# Patient Record
Sex: Female | Born: 1950 | Race: White | Hispanic: No | Smoking: Current every day smoker
Health system: Southern US, Community
[De-identification: ages and names within clinical notes are randomized; demographics above are authoritative.]

## PROBLEM LIST (undated history)

## (undated) DIAGNOSIS — E7800 Pure hypercholesterolemia, unspecified: Secondary | ICD-10-CM

## (undated) HISTORY — PX: COLONOSCOPY: SHX174

---

## 2000-08-13 ENCOUNTER — Ambulatory Visit (HOSPITAL_COMMUNITY): Admission: RE | Admit: 2000-08-13 | Discharge: 2000-08-13 | Payer: Self-pay | Admitting: Pulmonary Disease

## 2001-03-01 ENCOUNTER — Encounter: Payer: Self-pay | Admitting: Internal Medicine

## 2001-03-01 ENCOUNTER — Ambulatory Visit (HOSPITAL_COMMUNITY): Admission: RE | Admit: 2001-03-01 | Discharge: 2001-03-01 | Payer: Self-pay | Admitting: Internal Medicine

## 2001-11-09 ENCOUNTER — Encounter: Payer: Self-pay | Admitting: Internal Medicine

## 2001-11-09 ENCOUNTER — Emergency Department (HOSPITAL_COMMUNITY): Admission: EM | Admit: 2001-11-09 | Discharge: 2001-11-09 | Payer: Self-pay | Admitting: Internal Medicine

## 2004-04-10 ENCOUNTER — Ambulatory Visit (HOSPITAL_COMMUNITY): Admission: RE | Admit: 2004-04-10 | Discharge: 2004-04-10 | Payer: Self-pay | Admitting: Family Medicine

## 2004-07-05 ENCOUNTER — Ambulatory Visit: Payer: Self-pay | Admitting: Internal Medicine

## 2004-07-05 ENCOUNTER — Ambulatory Visit (HOSPITAL_COMMUNITY): Admission: RE | Admit: 2004-07-05 | Discharge: 2004-07-05 | Payer: Self-pay | Admitting: Internal Medicine

## 2004-11-22 ENCOUNTER — Emergency Department (HOSPITAL_COMMUNITY): Admission: EM | Admit: 2004-11-22 | Discharge: 2004-11-22 | Payer: Self-pay | Admitting: Emergency Medicine

## 2010-03-22 ENCOUNTER — Emergency Department (HOSPITAL_COMMUNITY)
Admission: EM | Admit: 2010-03-22 | Discharge: 2010-03-22 | Payer: Self-pay | Source: Home / Self Care | Admitting: Emergency Medicine

## 2010-03-22 LAB — COMPREHENSIVE METABOLIC PANEL
ALT: 20 U/L (ref 0–35)
AST: 21 U/L (ref 0–37)
Albumin: 3.9 g/dL (ref 3.5–5.2)
Alkaline Phosphatase: 88 U/L (ref 39–117)
CO2: 28 mEq/L (ref 19–32)
Chloride: 105 mEq/L (ref 96–112)
Creatinine, Ser: 0.52 mg/dL (ref 0.4–1.2)
GFR calc Af Amer: >60 mL/min (ref >60)
GFR calc non Af Amer: >60 mL/min (ref >60)
Potassium: 4.6 mEq/L (ref 3.5–5.1)
Total Bilirubin: 0.4 mg/dL (ref 0.3–1.2)

## 2010-03-22 LAB — DIFFERENTIAL
Basophils Absolute: 0 10*3/uL (ref 0.0–0.1)
Basophils Relative: 0 % (ref 0–1)
Eosinophils Absolute: 0.2 10*3/uL (ref 0.0–0.7)
Eosinophils Relative: 2 % (ref 0–5)
Lymphocytes Relative: 24 % (ref 12–46)
Lymphs Abs: 1.9 10*3/uL (ref 0.7–4.0)
Monocytes Absolute: 0.6 10*3/uL (ref 0.1–1.0)
Monocytes Relative: 8 % (ref 3–12)
Neutro Abs: 5.1 10*3/uL (ref 1.7–7.7)
Neutrophils Relative %: 65 % (ref 43–77)

## 2010-03-22 LAB — CBC
Hemoglobin: 13.4 g/dL (ref 12.0–15.0)
MCH: 32 pg (ref 26.0–34.0)
RBC: 4.19 MIL/uL (ref 3.87–5.11)
WBC: 7.8 10*3/uL (ref 4.0–10.5)

## 2010-03-22 LAB — URINALYSIS, ROUTINE W REFLEX MICROSCOPIC
Bilirubin Urine: NEGATIVE
Hgb urine dipstick: NEGATIVE
Ketones, ur: NEGATIVE mg/dL
Protein, ur: NEGATIVE mg/dL
Urine Glucose, Fasting: NEGATIVE mg/dL
Urobilinogen, UA: 0.2 mg/dL (ref 0.0–1.0)

## 2011-06-04 IMAGING — US US ABDOMEN COMPLETE
1 series · 14 of 25 positions shown · non-contrast
Comparison: None

CLINICAL DATA: Upper abdominal pain

ULTRASOUND ABDOMEN:
TECHNIQUE: Sonography of upper abdominal structures was performed.

[Series 1: us abdomen complete · 0.23mm/px · 14 of 63 slices shown]
[im 1/63]
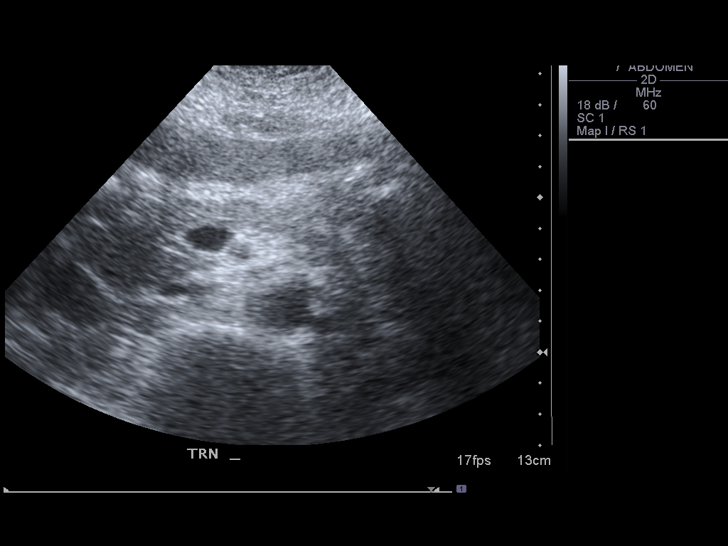
[im 6/63]
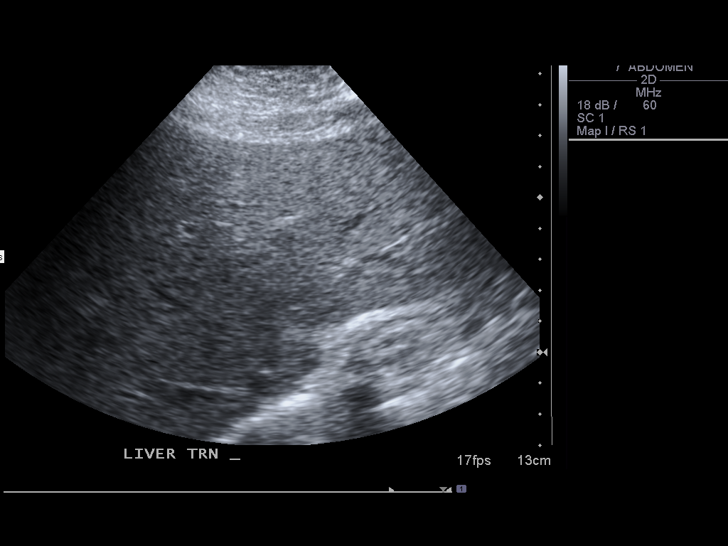
[im 11/63]
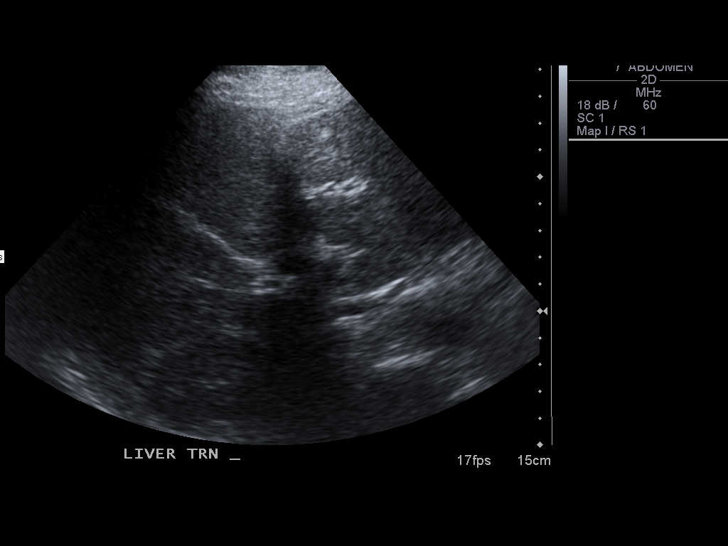
[im 16/63]
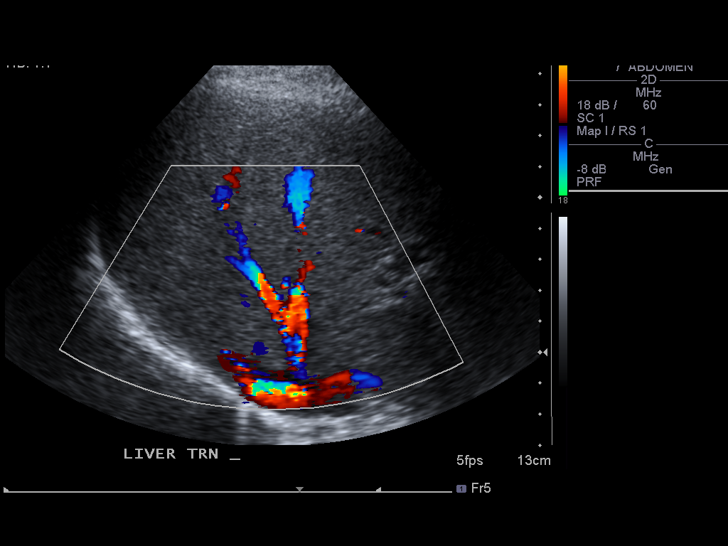
[im 21/63]
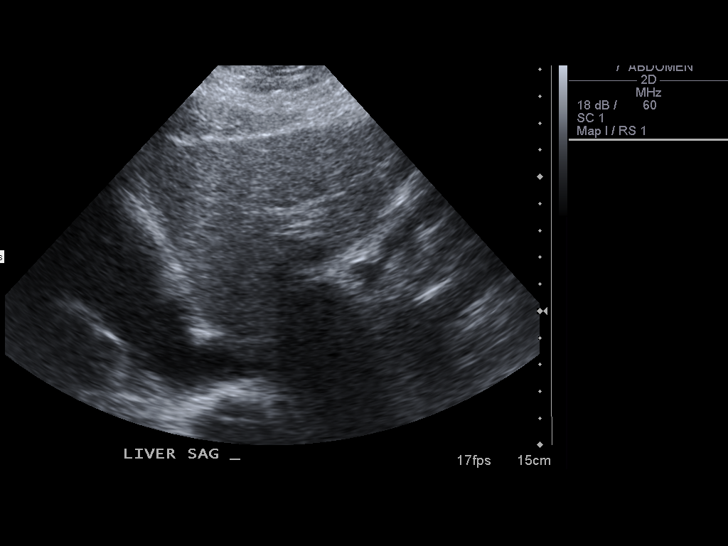
[im 24/63]
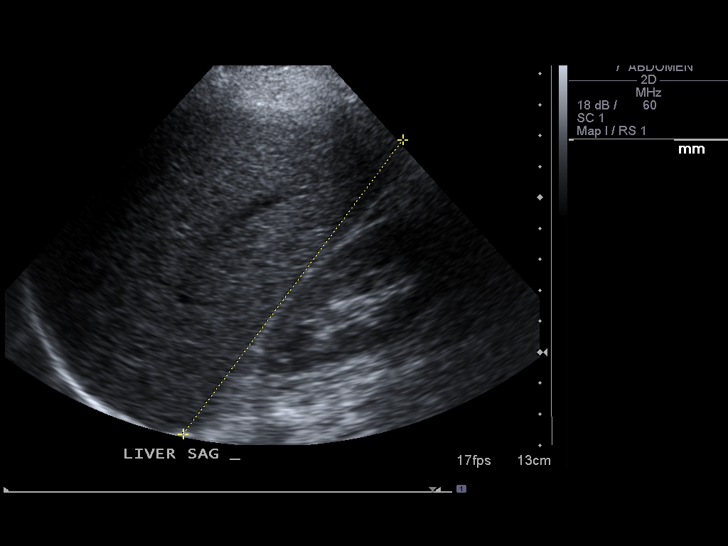
[im 29/63]
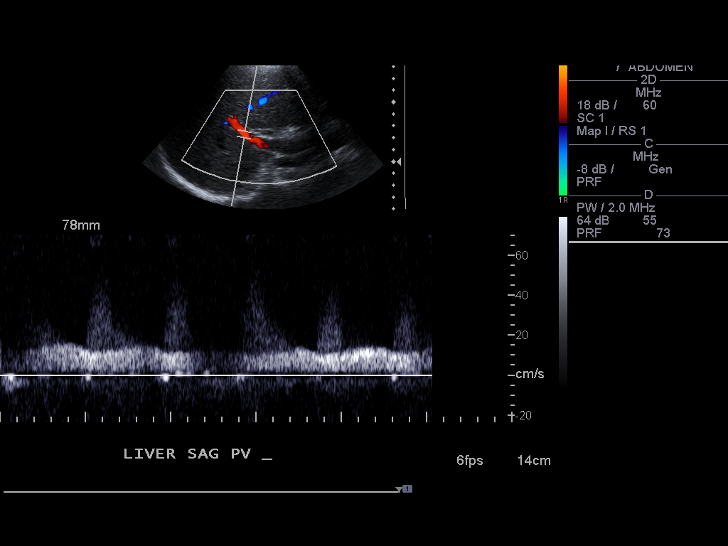
[im 34/63]
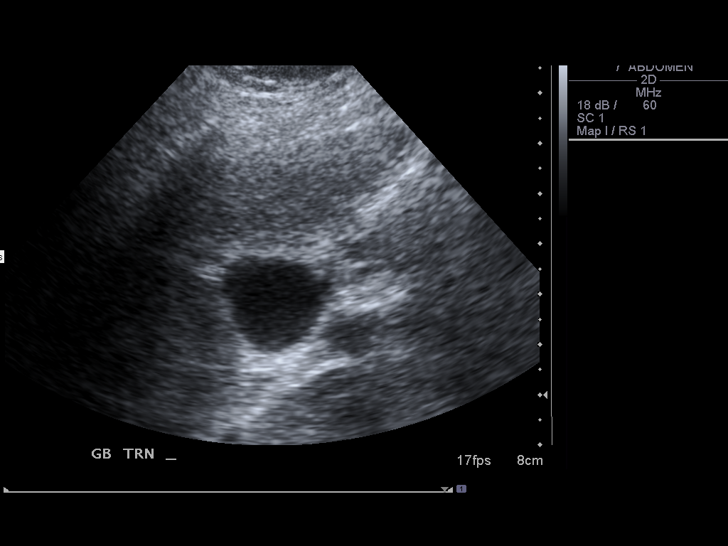
[im 39/63]
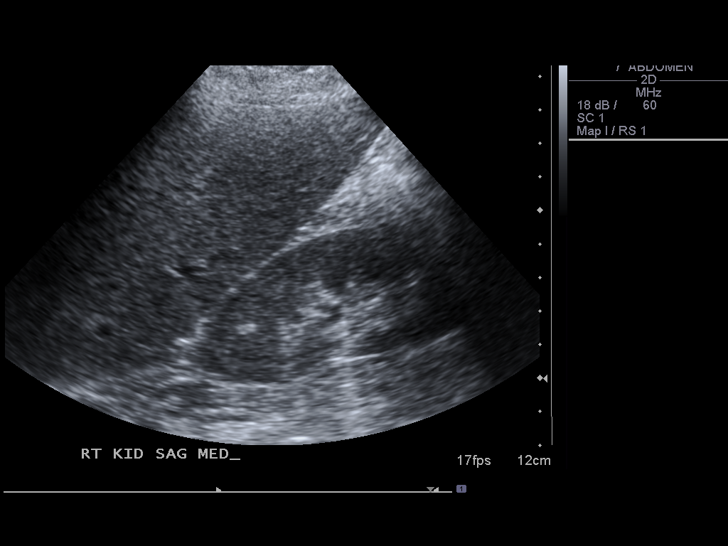
[im 42/63]
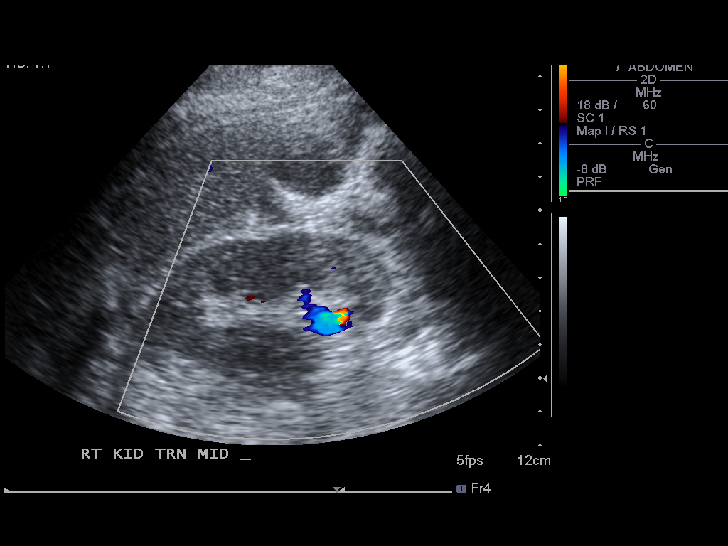
[im 47/63]
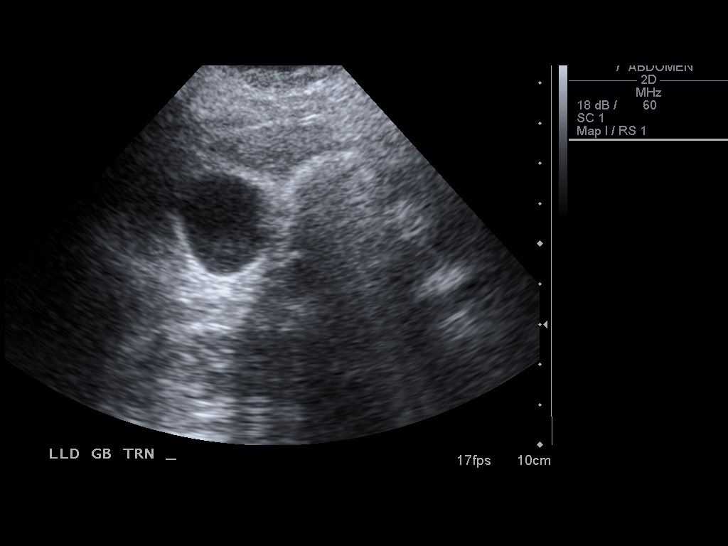
[im 52/63]
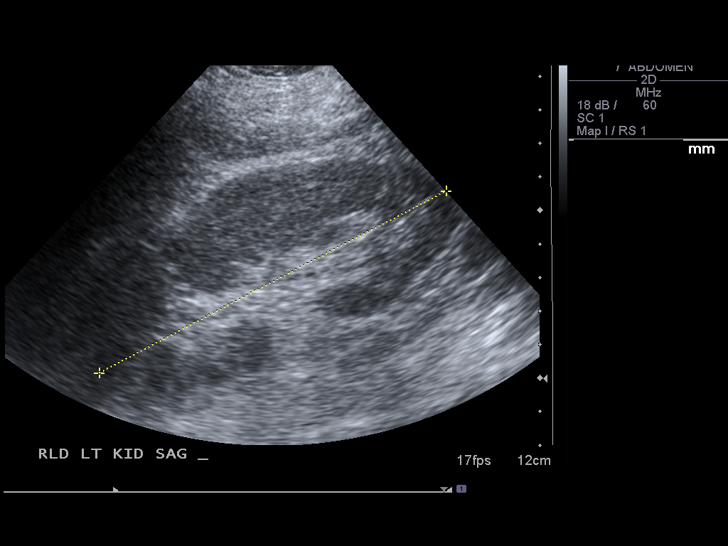
[im 57/63]
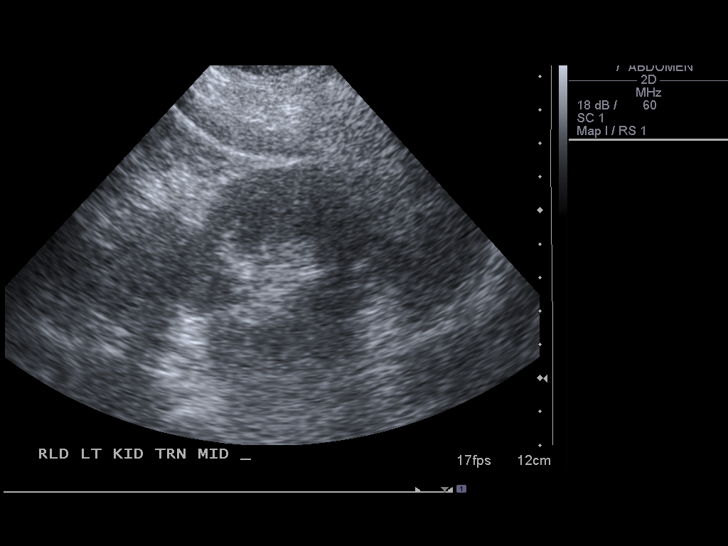
[im 63/63]
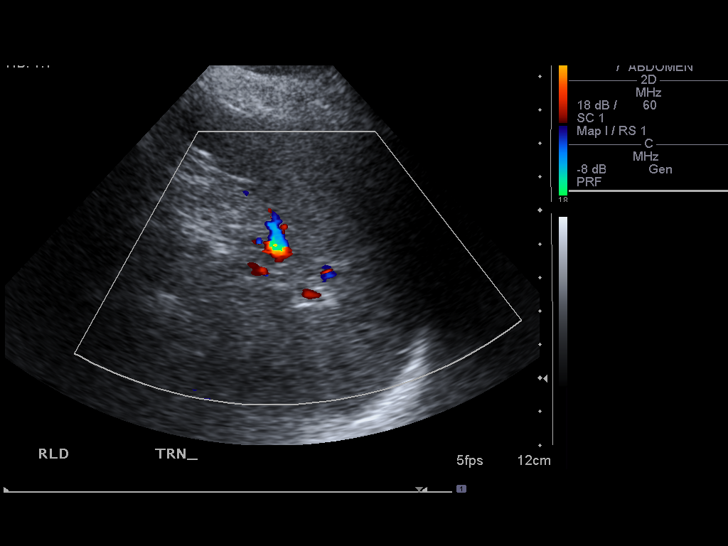

[14 of 25 positions shown; findings below may reference images not displayed]

Gallbladder:  Normally distended without stones or wall thickening.
No pericholecystic fluid or sonographic Murphy sign.

Common bile duct:  Normal caliber 5 mm diameter.

Liver:  Minimally heterogeneous echogenicity.  No focal mass.

IVC:  Unremarkable

Pancreas:  Head and distal tail obscured by bowel gas, body and
proximal tail normal appearance.

Spleen:  Normal appearance, 6.9 cm length.

Right kidney:  8.8 cm length. Normal morphology without mass or
hydronephrosis.

Left kidney:  12.0 cm length. Normal morphology without mass or
hydronephrosis.

Aorta:  Unremarkable

Other:  No free fluid
IMPRESSION: Incomplete pancreatic visualization.
No definite acute upper abdominal sonographic abnormalities
identified.

## 2012-06-09 ENCOUNTER — Other Ambulatory Visit (HOSPITAL_COMMUNITY): Payer: Self-pay | Admitting: Family Medicine

## 2012-06-09 DIAGNOSIS — Z139 Encounter for screening, unspecified: Secondary | ICD-10-CM

## 2012-07-08 ENCOUNTER — Ambulatory Visit (HOSPITAL_COMMUNITY)
Admission: RE | Admit: 2012-07-08 | Discharge: 2012-07-08 | Disposition: A | Discharge status: Home / Self Care | Payer: BC Managed Care – PPO | Source: Ambulatory Visit | Attending: Family Medicine | Admitting: Family Medicine

## 2012-07-08 DIAGNOSIS — Z1231 Encounter for screening mammogram for malignant neoplasm of breast: Secondary | ICD-10-CM | POA: Insufficient documentation

## 2012-07-08 DIAGNOSIS — Z139 Encounter for screening, unspecified: Secondary | ICD-10-CM

## 2013-08-15 ENCOUNTER — Other Ambulatory Visit (HOSPITAL_COMMUNITY): Payer: Self-pay | Admitting: Family Medicine

## 2013-08-15 DIAGNOSIS — Z1231 Encounter for screening mammogram for malignant neoplasm of breast: Secondary | ICD-10-CM

## 2013-08-18 ENCOUNTER — Ambulatory Visit (HOSPITAL_COMMUNITY)
Admission: RE | Admit: 2013-08-18 | Discharge: 2013-08-18 | Disposition: A | Discharge status: Home / Self Care | Payer: 59 | Source: Ambulatory Visit | Attending: Family Medicine | Admitting: Family Medicine

## 2013-08-18 DIAGNOSIS — Z1231 Encounter for screening mammogram for malignant neoplasm of breast: Secondary | ICD-10-CM | POA: Insufficient documentation

## 2013-08-18 DIAGNOSIS — R928 Other abnormal and inconclusive findings on diagnostic imaging of breast: Secondary | ICD-10-CM | POA: Insufficient documentation

## 2013-08-22 ENCOUNTER — Other Ambulatory Visit: Payer: Self-pay | Admitting: Family Medicine

## 2013-08-22 DIAGNOSIS — R928 Other abnormal and inconclusive findings on diagnostic imaging of breast: Secondary | ICD-10-CM

## 2013-08-30 ENCOUNTER — Ambulatory Visit (HOSPITAL_COMMUNITY)
Admission: RE | Admit: 2013-08-30 | Discharge: 2013-08-30 | Disposition: A | Discharge status: Home / Self Care | Payer: 59 | Source: Ambulatory Visit | Attending: Family Medicine | Admitting: Family Medicine

## 2013-08-30 DIAGNOSIS — N6489 Other specified disorders of breast: Secondary | ICD-10-CM | POA: Insufficient documentation

## 2013-08-30 DIAGNOSIS — R928 Other abnormal and inconclusive findings on diagnostic imaging of breast: Secondary | ICD-10-CM

## 2014-06-22 ENCOUNTER — Telehealth: Payer: Self-pay | Admitting: Internal Medicine

## 2014-06-22 NOTE — Telephone Encounter (Signed)
Letter mailed to pt.  

## 2014-06-22 NOTE — Telephone Encounter (Signed)
TCS RECALL  °

## 2014-06-28 ENCOUNTER — Telehealth: Payer: Self-pay | Admitting: Internal Medicine

## 2014-06-28 NOTE — Telephone Encounter (Signed)
Patient called to speak with DS about getting set up for her colonoscopy. She had received a letter. Please call her back at [phone]

## 2014-06-29 NOTE — Telephone Encounter (Signed)
[facility]

## 2014-07-04 ENCOUNTER — Telehealth: Payer: Self-pay

## 2014-07-04 ENCOUNTER — Other Ambulatory Visit: Payer: Self-pay

## 2014-07-04 DIAGNOSIS — Z1211 Encounter for screening for malignant neoplasm of colon: Secondary | ICD-10-CM

## 2014-07-04 NOTE — Telephone Encounter (Signed)
Pt was on recall. Her last colonoscopy was by Dr. Jena Gaussourk on 07/05/2004.

## 2014-07-05 NOTE — Telephone Encounter (Signed)
Pt is scheduled for 07/31/2014 with Dr. Jena Gaussourk at 8:30 Am.

## 2014-07-05 NOTE — Telephone Encounter (Signed)
CORRECTION: PT IS SCHEDULED FOR COLONOSCOPY WITH DR. Jena GaussOURK ON 08/16/2014 AT 8:30 AM.

## 2014-07-06 MED ORDER — PEG-KCL-NACL-NASULF-NA ASC-C 100 G PO SOLR: 1.0000 | ORAL | Status: DC

## 2014-07-06 NOTE — Telephone Encounter (Signed)
Appropriate.

## 2014-07-06 NOTE — Telephone Encounter (Signed)
Gastroenterology Pre-Procedure Review  Request Date: 07/04/2014 Requesting Physician: WAS ON RECALL/ LAST TCS DONE 07/05/2004  PATIENT REVIEW QUESTIONS: The patient responded to the following health history questions as indicated:    PT SAID SHE IS NOT TAKING ANY MEDICATIONS SHE SAID SHE WAS ALLERGIC TO AN ANTIBIOTIC/ DOES NOT REMEMBER WHICH ONE/ BUT IT ONLY CAUSED NAUSEA  1. Diabetes Melitis: no 2. Joint replacements in the past 12 months: no 3. Major health problems in the past 3 months: no 4. Has an artificial valve or MVP: no 5. Has a defibrillator: no 6. Has been advised in past to take antibiotics in advance of a procedure like teeth cleaning: no    MEDICATIONS & ALLERGIES:    Patient reports the following regarding taking any blood thinners:   Plavix? no Aspirin? no Coumadin? no  Patient confirms/reports the following medications:  No current outpatient prescriptions on file.   No current facility-administered medications for this visit.    Patient confirms/reports the following allergies:  No Known Allergies  No orders of the defined types were placed in this encounter.    AUTHORIZATION INFORMATION Primary Insurance:   ID #:  Group #:  Pre-Cert / Auth required:  Pre-Cert / Auth #:   Secondary Insurance:  ID #:  Group #:  Pre-Cert / Auth required:  Pre-Cert / Auth #:   SCHEDULE INFORMATION: Procedure has been scheduled as follows:  Date:  08/16/2014                   Time: 8:30 am   Location: [facility] Short Stay  This Gastroenterology Pre-Precedure Review Form is being routed to the following provider(s): R. Roetta SessionsMichael Rourk, MD

## 2014-07-06 NOTE — Telephone Encounter (Signed)
Rx sent to the pharmacy and instructions mailed to pt.  

## 2014-08-07 ENCOUNTER — Telehealth: Payer: Self-pay

## 2014-08-07 NOTE — Telephone Encounter (Signed)
REVIEWED-NO ADDITIONAL RECOMMENDATIONS. 

## 2014-08-07 NOTE — Telephone Encounter (Signed)
I called pt to update med list and she is still not taking any meds.

## 2014-08-08 ENCOUNTER — Telehealth: Payer: Self-pay

## 2014-08-08 NOTE — Telephone Encounter (Signed)
Pt was on recall and I had triaged and scheduled colonoscopy for 08/16/2014. I was doing insurance and she has [employer] and will need referral from her PCP.  I left her Vm that we are cancelling the procedure for 08/16/2014 and she needs to have her PCP send referral. I have called Eber Jones and took off of the schedule.  Routing to Ginger to make sure the pt is called again since I will be on vacation the next few days.

## 2014-08-08 NOTE — Telephone Encounter (Signed)
PT called and she is aware. She will have Dr. Janna Arch send the referral.

## 2014-08-14 ENCOUNTER — Other Ambulatory Visit (HOSPITAL_COMMUNITY): Payer: Self-pay | Admitting: Family Medicine

## 2014-08-14 ENCOUNTER — Ambulatory Visit (HOSPITAL_COMMUNITY)
Admission: RE | Admit: 2014-08-14 | Discharge: 2014-08-14 | Disposition: A | Discharge status: Home / Self Care | Payer: 59 | Source: Ambulatory Visit | Attending: Family Medicine | Admitting: Family Medicine

## 2014-08-14 DIAGNOSIS — M1610 Unilateral primary osteoarthritis, unspecified hip: Secondary | ICD-10-CM

## 2014-08-14 DIAGNOSIS — M25651 Stiffness of right hip, not elsewhere classified: Secondary | ICD-10-CM | POA: Diagnosis not present

## 2014-08-14 DIAGNOSIS — M25551 Pain in right hip: Secondary | ICD-10-CM | POA: Diagnosis present

## 2014-08-14 DIAGNOSIS — Z1231 Encounter for screening mammogram for malignant neoplasm of breast: Secondary | ICD-10-CM

## 2014-08-16 ENCOUNTER — Ambulatory Visit (HOSPITAL_COMMUNITY): Admission: RE | Admit: 2014-08-16 | Payer: 59 | Source: Ambulatory Visit | Admitting: Internal Medicine

## 2014-08-16 ENCOUNTER — Encounter (HOSPITAL_COMMUNITY): Admission: RE | Payer: Self-pay | Source: Ambulatory Visit

## 2014-08-16 SURGERY — COLONOSCOPY
Anesthesia: Moderate Sedation

## 2014-09-01 ENCOUNTER — Ambulatory Visit (HOSPITAL_COMMUNITY)
Admission: RE | Admit: 2014-09-01 | Discharge: 2014-09-01 | Disposition: A | Discharge status: Home / Self Care | Payer: 59 | Source: Ambulatory Visit | Attending: Family Medicine | Admitting: Family Medicine

## 2014-09-01 DIAGNOSIS — Z1231 Encounter for screening mammogram for malignant neoplasm of breast: Secondary | ICD-10-CM | POA: Diagnosis present

## 2014-10-18 ENCOUNTER — Telehealth: Payer: Self-pay

## 2014-10-18 NOTE — Telephone Encounter (Signed)
I called pt to get rescheduled for colonoscopy. Previously scheduled in June 2016 and did not have the compass online referral. The Online Referral is [id number] and is good 08-14-2014-  Thru 02/13/2015. I left VM for a return call to schedule.

## 2014-10-19 NOTE — Telephone Encounter (Signed)
Gastroenterology Pre-Procedure Review  Request Date: 10/18/2014 Requesting Physician: Dr. Lucia Gaskins  PATIENT REVIEW QUESTIONS: The patient responded to the following health history questions as indicated:    1. Diabetes Melitis: no 2. Joint replacements in the past 12 months: no 3. Major health problems in the past 3 months: no 4. Has an artificial valve or MVP: no 5. Has a defibrillator: no 6. Has been advised in past to take antibiotics in advance of a procedure like teeth cleaning: no    MEDICATIONS & ALLERGIES:    Patient reports the following regarding taking any blood thinners:   Plavix? no Aspirin? no Coumadin? no  Patient confirms/reports the following medications:  Current Outpatient Prescriptions  Medication Sig Dispense Refill  . ezetimibe (ZETIA) 10 MG tablet Take 10 mg by mouth daily.    . NON FORMULARY Vitamin D3  5000 IU  qd    . peg 3350 powder (MOVIPREP) 100 G SOLR Take 1 kit (200 g total) by mouth as directed. 1 kit 0   No current facility-administered medications for this visit.    Patient confirms/reports the following allergies:  No Known Allergies  No orders of the defined types were placed in this encounter.    AUTHORIZATION INFORMATION Primary Insurance:   ID #:  Group #:  Pre-Cert / Auth required:  Pre-Cert / Auth #:   Secondary Insurance:   ID #:  Group #:  Pre-Cert / Auth required:  Pre-Cert / Auth #:   SCHEDULE INFORMATION: Procedure has been scheduled as follows:  Date:                 Time:   Location:   This Gastroenterology Pre-Precedure Review Form is being routed to the following provider(s): R. Garfield Cornea, MD

## 2014-10-23 ENCOUNTER — Other Ambulatory Visit: Payer: Self-pay

## 2014-10-23 DIAGNOSIS — Z1211 Encounter for screening for malignant neoplasm of colon: Secondary | ICD-10-CM

## 2014-10-23 NOTE — Telephone Encounter (Signed)
Pt is scheduled for 11/15/2014 at 2:00 PM for the colonoscopy with Dr. Jena Gauss.  Her last one was 07/05/2004 by Dr. Jena Gauss.

## 2014-10-26 MED ORDER — PEG 3350-KCL-NA BICARB-NACL 420 G PO SOLR: 4000.0000 mL | ORAL | Status: AC

## 2014-10-26 NOTE — Telephone Encounter (Signed)
Rx sent to the pharmacy and instructions mailed to pt.  

## 2014-10-26 NOTE — Telephone Encounter (Signed)
Appropriate.

## 2014-11-14 ENCOUNTER — Telehealth: Payer: Self-pay

## 2014-11-14 NOTE — Telephone Encounter (Signed)
I called [employer] @ [phone] and spoke to Othella Boyer who said PA is required for screening colonoscopy. Reference # for the online referral from PCP was [id number].  PA # [id number] and is pending.  [facility] for Renae at [facility] [phone].

## 2014-11-15 ENCOUNTER — Encounter (HOSPITAL_COMMUNITY)
Admission: RE | Disposition: A | Discharge status: Home Health | Payer: Self-pay | Source: Ambulatory Visit | Attending: Internal Medicine

## 2014-11-15 ENCOUNTER — Ambulatory Visit (HOSPITAL_COMMUNITY)
Admission: RE | Admit: 2014-11-15 | Discharge: 2014-11-15 | Disposition: A | Discharge status: Home Health | Payer: 59 | Source: Ambulatory Visit | Attending: Internal Medicine | Admitting: Internal Medicine

## 2014-11-15 ENCOUNTER — Encounter (HOSPITAL_COMMUNITY): Payer: Self-pay | Admitting: *Deleted

## 2014-11-15 DIAGNOSIS — Z1211 Encounter for screening for malignant neoplasm of colon: Secondary | ICD-10-CM | POA: Insufficient documentation

## 2014-11-15 DIAGNOSIS — F17210 Nicotine dependence, cigarettes, uncomplicated: Secondary | ICD-10-CM | POA: Insufficient documentation

## 2014-11-15 DIAGNOSIS — E780 Pure hypercholesterolemia: Secondary | ICD-10-CM | POA: Insufficient documentation

## 2014-11-15 DIAGNOSIS — K648 Other hemorrhoids: Secondary | ICD-10-CM | POA: Diagnosis not present

## 2014-11-15 DIAGNOSIS — Z79899 Other long term (current) drug therapy: Secondary | ICD-10-CM | POA: Diagnosis not present

## 2014-11-15 DIAGNOSIS — K5730 Diverticulosis of large intestine without perforation or abscess without bleeding: Secondary | ICD-10-CM | POA: Insufficient documentation

## 2014-11-15 HISTORY — DX: Pure hypercholesterolemia, unspecified: E78.00

## 2014-11-15 HISTORY — PX: COLONOSCOPY: SHX5424

## 2014-11-15 SURGERY — COLONOSCOPY
Anesthesia: Moderate Sedation

## 2014-11-15 MED ORDER — MEPERIDINE HCL 100 MG/ML IJ SOLN
INTRAMUSCULAR | Status: AC
  Filled 2014-11-15: qty 2

## 2014-11-15 MED ORDER — MIDAZOLAM HCL 5 MG/5ML IJ SOLN
INTRAMUSCULAR | Status: AC
  Filled 2014-11-15: qty 10

## 2014-11-15 MED ORDER — STERILE WATER FOR IRRIGATION IR SOLN
Status: DC | PRN
  Administered 2014-11-15: 13:00:00

## 2014-11-15 MED ORDER — MIDAZOLAM HCL 5 MG/5ML IJ SOLN
INTRAMUSCULAR | Status: DC | PRN
  Administered 2014-11-15: 2 mg via INTRAVENOUS
  Administered 2014-11-15: 1 mg via INTRAVENOUS
  Administered 2014-11-15: 2 mg via INTRAVENOUS

## 2014-11-15 MED ORDER — MEPERIDINE HCL 100 MG/ML IJ SOLN
INTRAMUSCULAR | Status: DC | PRN
  Administered 2014-11-15 (×3): 25 mg via INTRAVENOUS

## 2014-11-15 MED ORDER — SODIUM CHLORIDE 0.9 % IV SOLN
INTRAVENOUS | Status: DC
  Administered 2014-11-15: 13:00:00 via INTRAVENOUS

## 2014-11-15 MED ORDER — ONDANSETRON HCL 4 MG/2ML IJ SOLN
INTRAMUSCULAR | Status: DC | PRN
  Administered 2014-11-15: 4 mg via INTRAVENOUS

## 2014-11-15 MED ORDER — ONDANSETRON HCL 4 MG/2ML IJ SOLN
INTRAMUSCULAR | Status: AC
  Filled 2014-11-15: qty 2

## 2014-11-15 NOTE — Op Note (Signed)
[facility] [address] [place], [address]   COLONOSCOPY PROCEDURE REPORT  PATIENT: Colleen, Myers  MR#: [id number] BIRTHDATE: Jun 27, 1950 , 64  yrs. old GENDER: female ENDOSCOPIST: R.  Roetta Sessions, MD FACP [facility] REFERRED [id number] Janna Arch, M.D. PROCEDURE DATE:  12/01/14 PROCEDURE:   Colonoscopy, screening INDICATIONS:Average risk colorectal cancer screening examination. MEDICATIONS: Versed 5 mg IV and Demerol 75 mg IV in divided doses. Zofran 4 mg IV. ASA CLASS:       Class I  CONSENT: The risks, benefits, alternatives and imponderables including but not limited to bleeding, perforation as well as the possibility of a missed lesion have been reviewed.  The potential for biopsy, lesion removal, etc. have also been discussed. Questions have been answered.  All parties agreeable.  Please see the history and physical in the medical record for more information.  DESCRIPTION OF PROCEDURE:   After the risks benefits and alternatives of the procedure were thoroughly explained, informed consent was obtained.  The digital rectal exam revealed no abnormalities of the rectum.   The EC-[id number] ([id number])  endoscope was introduced through the anus and advanced to the cecum, which was identified by both the appendix and ileocecal valve. No adverse events experienced.   The quality of the prep was adequate  The instrument was then slowly withdrawn as the colon was fully examined. Estimated blood loss is zero unless otherwise noted in this procedure report.      COLON FINDINGS: Internal hemorrhoids; otherwise, normal appearing rectal mucosa.  Scattered sigmoid diverticula; otherwise, the remainder of the colonic mucosa appeared normal.  Retroflexion was performed. .  Withdrawal time=6 minutes 0 seconds.  The scope was withdrawn and the procedure completed. COMPLICATIONS:  ENDOSCOPIC IMPRESSION: Internal hemorrhoids. Colonic  diverticulosis.  RECOMMENDATIONS: Repeat screening colonoscopy in 10 years  eSigned:  R. Roetta Sessions, MD Jerrel Ivory [facility] 12/01/14 2:01 PM   cc:  CPT CODES: ICD CODES:  The ICD and CPT codes recommended by this software are interpretations from the data that the clinical staff has captured with the software.  The verification of the translation of this report to the ICD and CPT codes and modifiers is the sole responsibility of the health care institution and practicing physician where this report was generated.  [employer] will not be held responsible for the validity of the ICD and CPT codes included on this report.  AMA assumes no liability for data contained or not contained herein. CPT is a Publishing rights manager of the [employer].

## 2014-11-15 NOTE — H&P (Signed)
@  VOZD@   Primary Care Physician:  Isabella Stalling, MD Primary Gastroenterologist:  Dr. Jena Gauss  Pre-Procedure History & Physical: HPI:  Colleen Myers is a 64 y.o. female is here for a screening colonoscopy. Negative colonoscopy 10 years ago. No bowel symptoms. No family history of colon cancer.  Past Medical History  Diagnosis Date  . Hypercholesteremia     Past Surgical History  Procedure Laterality Date  . Colonoscopy      Prior to Admission medications   Medication Sig Start Date End Date Taking? Authorizing Reneisha Stilley  Cholecalciferol (VITAMIN D3) 5000 UNITS TABS Take 1 tablet by mouth daily.   Yes Historical Clif Serio, MD  ezetimibe (ZETIA) 10 MG tablet Take 10 mg by mouth daily.   Yes Historical Kanishk Stroebel, MD  polyethylene glycol-electrolytes (TRILYTE) 420 G solution Take 4,000 mLs by mouth as directed. 10/26/14  Yes Corbin Ade, MD    Allergies as of 10/23/2014  . (No Known Allergies)    History reviewed. No pertinent family history.  Social History   Social History  . Marital Status: Divorced    Spouse Name: N/A  . Number of Children: N/A  . Years of Education: N/A   Occupational History  . Not on file.   Social History Main Topics  . Smoking status: Current Every Day Smoker -- 0.25 packs/day for 40 years    Types: Cigarettes  . Smokeless tobacco: Not on file  . Alcohol Use: No  . Drug Use: No  . Sexual Activity: Not on file   Other Topics Concern  . Not on file   Social History Narrative  . No narrative on file    Review of Systems: See HPI, otherwise negative ROS  Physical Exam: BP 145/81 mmHg  Pulse 88  Temp(Src) 97.7 F (36.5 C) (Oral)  Resp 15  Ht  (1.575 m)  Wt 130 lb (58.968 kg)  BMI 23.77 kg/m2  SpO2 97% General:   Alert,  Well-developed, well-nourished, pleasant and cooperative in NAD Head:  Normocephalic and atraumatic. Lungs:  Clear throughout to auscultation.   No wheezes, crackles, or rhonchi. No acute  distress. Heart:  Regular rate and rhythm; no murmurs, clicks, rubs,  or gallops. Abdomen:  Soft, nontender and nondistended. No masses, hepatosplenomegaly or hernias noted. Normal bowel sounds, without guarding, and without rebound.   Msk:  Symmetrical without gross deformities. Normal posture. Pulses:  Normal pulses noted.   Impression/Plan: Colleen Myers is now here to undergo a screening colonoscopy.  Average risk for any examination Risks, benefits, limitations, imponderables and alternatives regarding colonoscopy have been reviewed with the patient. Questions have been answered. All parties agreeable.     Notice:  This dictation was prepared with Dragon dictation along with smaller phrase technology. Any transcriptional errors that result from this process are unintentional and may not be corrected upon review.

## 2014-11-15 NOTE — Discharge Instructions (Signed)
Colonoscopy Discharge Instructions  Read the instructions outlined below and refer to this sheet in the next few weeks. These discharge instructions provide you with general information on caring for yourself after you leave the hospital. Your doctor may also give you specific instructions. While your treatment has been planned according to the most current medical practices available, unavoidable complications occasionally occur. If you have any problems or questions after discharge, call Dr. Jena Gauss at [phone]. ACTIVITY  You may resume your regular activity, but move at a slower pace for the next 24 hours.   Take frequent rest periods for the next 24 hours.   Walking will help get rid of the air and reduce the bloated feeling in your belly (abdomen).   No driving for 24 hours (because of the medicine (anesthesia) used during the test).    Do not sign any important legal documents or operate any machinery for 24 hours (because of the anesthesia used during the test).  NUTRITION  Drink plenty of fluids.   You may resume your normal diet as instructed by your doctor.   Begin with a light meal and progress to your normal diet. Heavy or fried foods are harder to digest and may make you feel sick to your stomach (nauseated).   Avoid alcoholic beverages for 24 hours or as instructed.  MEDICATIONS  You may resume your normal medications unless your doctor tells you otherwise.  WHAT YOU CAN EXPECT TODAY  Some feelings of bloating in the abdomen.   Passage of more gas than usual.   Spotting of blood in your stool or on the toilet paper.  IF YOU HAD POLYPS REMOVED DURING THE COLONOSCOPY:  No aspirin products for 7 days or as instructed.   No alcohol for 7 days or as instructed.   Eat a soft diet for the next 24 hours.  FINDING OUT THE RESULTS OF YOUR TEST Not all test results are available during your visit. If your test results are not back during the visit, make an appointment  with your caregiver to find out the results. Do not assume everything is normal if you have not heard from your caregiver or the medical facility. It is important for you to follow up on all of your test results.  SEEK IMMEDIATE MEDICAL ATTENTION IF:  You have more than a spotting of blood in your stool.   Your belly is swollen (abdominal distention).   You are nauseated or vomiting.   You have a temperature over 101.   You have abdominal pain or discomfort that is severe or gets worse throughout the day.    Diverticulosis information provided  Repeat colonoscopy in 10 years for screening purposes  Diverticulosis Diverticulosis is the condition that develops when small pouches (diverticula) form in the wall of your colon. Your colon, or large intestine, is where water is absorbed and stool is formed. The pouches form when the inside layer of your colon pushes through weak spots in the outer layers of your colon. CAUSES  No one knows exactly what causes diverticulosis. RISK FACTORS  Being older than 50. Your risk for this condition increases with age. Diverticulosis is rare in people younger than 40 years. By age 44, almost everyone has it.  Eating a low-fiber diet.  Being frequently constipated.  Being overweight.  Not getting enough exercise.  Smoking.  Taking over-the-counter pain medicines, like aspirin and ibuprofen. SYMPTOMS  Most people with diverticulosis do not have symptoms. DIAGNOSIS  Because diverticulosis often has  no symptoms, health care providers often discover the condition during an exam for other colon problems. In many cases, a health care provider will diagnose diverticulosis while using a flexible scope to examine the colon (colonoscopy). TREATMENT  If you have never developed an infection related to diverticulosis, you may not need treatment. If you have had an infection before, treatment may include:  Eating more fruits, vegetables, and  grains.  Taking a fiber supplement.  Taking a live bacteria supplement (probiotic).  Taking medicine to relax your colon. HOME CARE INSTRUCTIONS   Drink at least 6-8 glasses of water each day to prevent constipation.  Try not to strain when you have a bowel movement.  Keep all follow-up appointments. If you have had an infection before:  Increase the fiber in your diet as directed by your health care provider or dietitian.  Take a dietary fiber supplement if your health care provider approves.  Only take medicines as directed by your health care provider. SEEK MEDICAL CARE IF:   You have abdominal pain.  You have bloating.  You have cramps.  You have not gone to the bathroom in 3 days. SEEK IMMEDIATE MEDICAL CARE IF:   Your pain gets worse.  Yourbloating becomes very bad.  You have a fever or chills, and your symptoms suddenly get worse.  You begin vomiting.  You have bowel movements that are bloody or black. MAKE SURE YOU:  Understand these instructions.  Will watch your condition.  Will get help right away if you are not doing well or get worse. High-Fiber Diet Fiber is found in fruits, vegetables, and grains. A high-fiber diet encourages the addition of more whole grains, legumes, fruits, and vegetables in your diet. The recommended amount of fiber for adult males is 38 g per day. For adult females, it is 25 g per day. Pregnant and lactating women should get 28 g of fiber per day. If you have a digestive or bowel problem, ask your caregiver for advice before adding high-fiber foods to your diet. Eat a variety of high-fiber foods instead of only a select few type of foods.  PURPOSE  To increase stool bulk.  To make bowel movements more regular to prevent constipation.  To lower cholesterol.  To prevent overeating. WHEN IS THIS DIET USED?  It may be used if you have constipation and hemorrhoids.  It may be used if you have uncomplicated diverticulosis  (intestine condition) and irritable bowel syndrome.  It may be used if you need help with weight management.  It may be used if you want to add it to your diet as a protective measure against atherosclerosis, diabetes, and cancer. SOURCES OF FIBER  Whole-grain breads and cereals.  Fruits, such as apples, oranges, bananas, berries, prunes, and pears.  Vegetables, such as green peas, carrots, sweet potatoes, beets, broccoli, cabbage, spinach, and artichokes.  Legumes, such split peas, soy, lentils.  Almonds. FIBER CONTENT IN FOODS Starches and Grains / Dietary Fiber (g)  Cheerios, 1 cup / 3 g  Corn Flakes cereal, 1 cup / 0.7 g  Rice crispy treat cereal, 1 cup / 0.3 g  Instant oatmeal (cooked),  cup / 2 g  Frosted wheat cereal, 1 cup / 5.1 g  Brown, long-grain rice (cooked), 1 cup / 3.5 g  White, long-grain rice (cooked), 1 cup / 0.6 g  Enriched macaroni (cooked), 1 cup / 2.5 g Legumes / Dietary Fiber (g)  Baked beans (canned, plain, or vegetarian),  cup / 5.2  g  Kidney beans (canned),  cup / 6.8 g  Pinto beans (cooked),  cup / 5.5 g Breads and Crackers / Dietary Fiber (g)  Plain or honey graham crackers, 2 squares / 0.7 g  Saltine crackers, 3 squares / 0.3 g  Plain, salted pretzels, 10 pieces / 1.8 g  Whole-wheat bread, 1 slice / 1.9 g  White bread, 1 slice / 0.7 g  Raisin bread, 1 slice / 1.2 g  Plain bagel, 3 oz / 2 g  Flour tortilla, 1 oz / 0.9 g  Corn tortilla, 1 small / 1.5 g  Hamburger or hotdog bun, 1 small / 0.9 g Fruits / Dietary Fiber (g)  Apple with skin, 1 medium / 4.4 g  Sweetened applesauce,  cup / 1.5 g  Banana,  medium / 1.5 g  Grapes, 10 grapes / 0.4 g  Orange, 1 small / 2.3 g  Raisin, 1.5 oz / 1.6 g  Melon, 1 cup / 1.4 g Vegetables / Dietary Fiber (g)  Green beans (canned),  cup / 1.3 g  Carrots (cooked),  cup / 2.3 g  Broccoli (cooked),  cup / 2.8 g  Peas (cooked),  cup / 4.4 g  Mashed potatoes,  cup /  1.6 g  Lettuce, 1 cup / 0.5 g  Corn (canned),  cup / 1.6 g  Tomato,  cup / 1.1 g

## 2014-11-22 ENCOUNTER — Encounter (HOSPITAL_COMMUNITY): Payer: Self-pay | Admitting: Internal Medicine

## 2015-10-27 IMAGING — DX DG HIP (WITH OR WITHOUT PELVIS) 2-3V*R*
3 series · 3 of 3 positions shown · non-contrast
Comparison: None.

CLINICAL DATA: Right hip pain and stiffness, no injury.

EXAM:
RIGHT HIP (WITH PELVIS) 2-3 VIEWS

[pelvis ap]
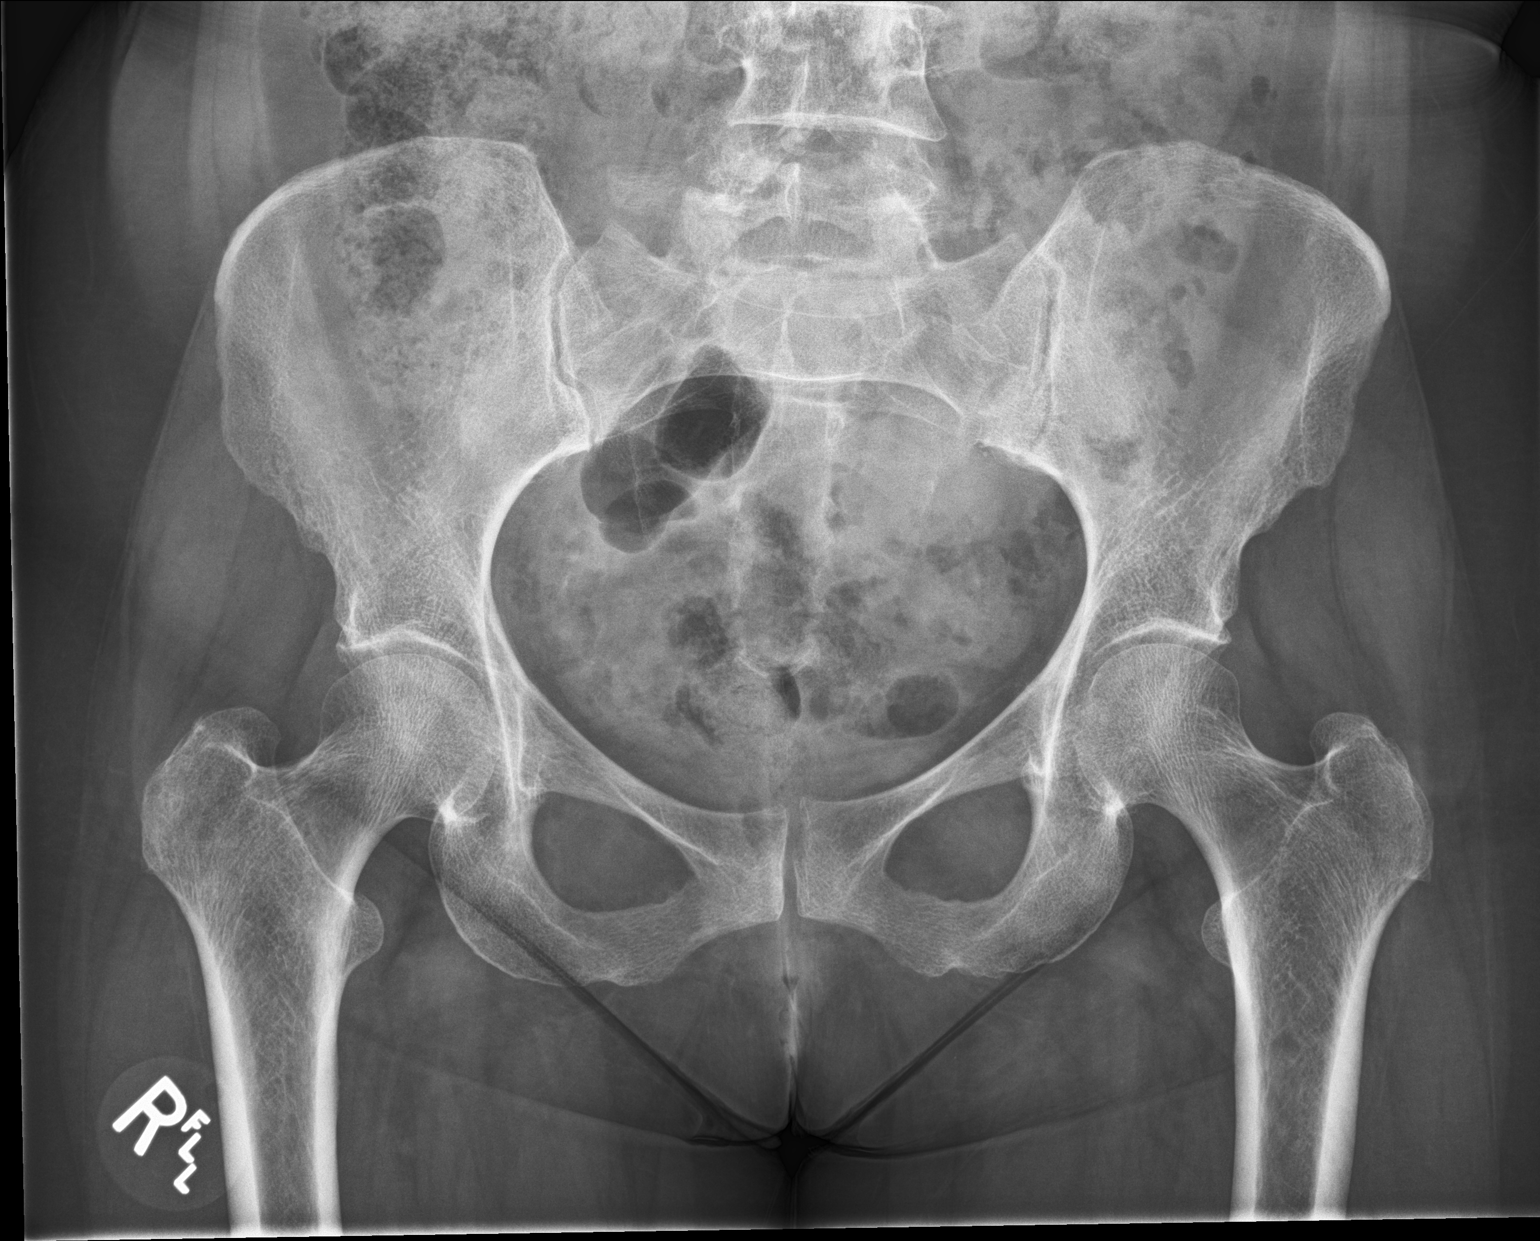

[hip ap]
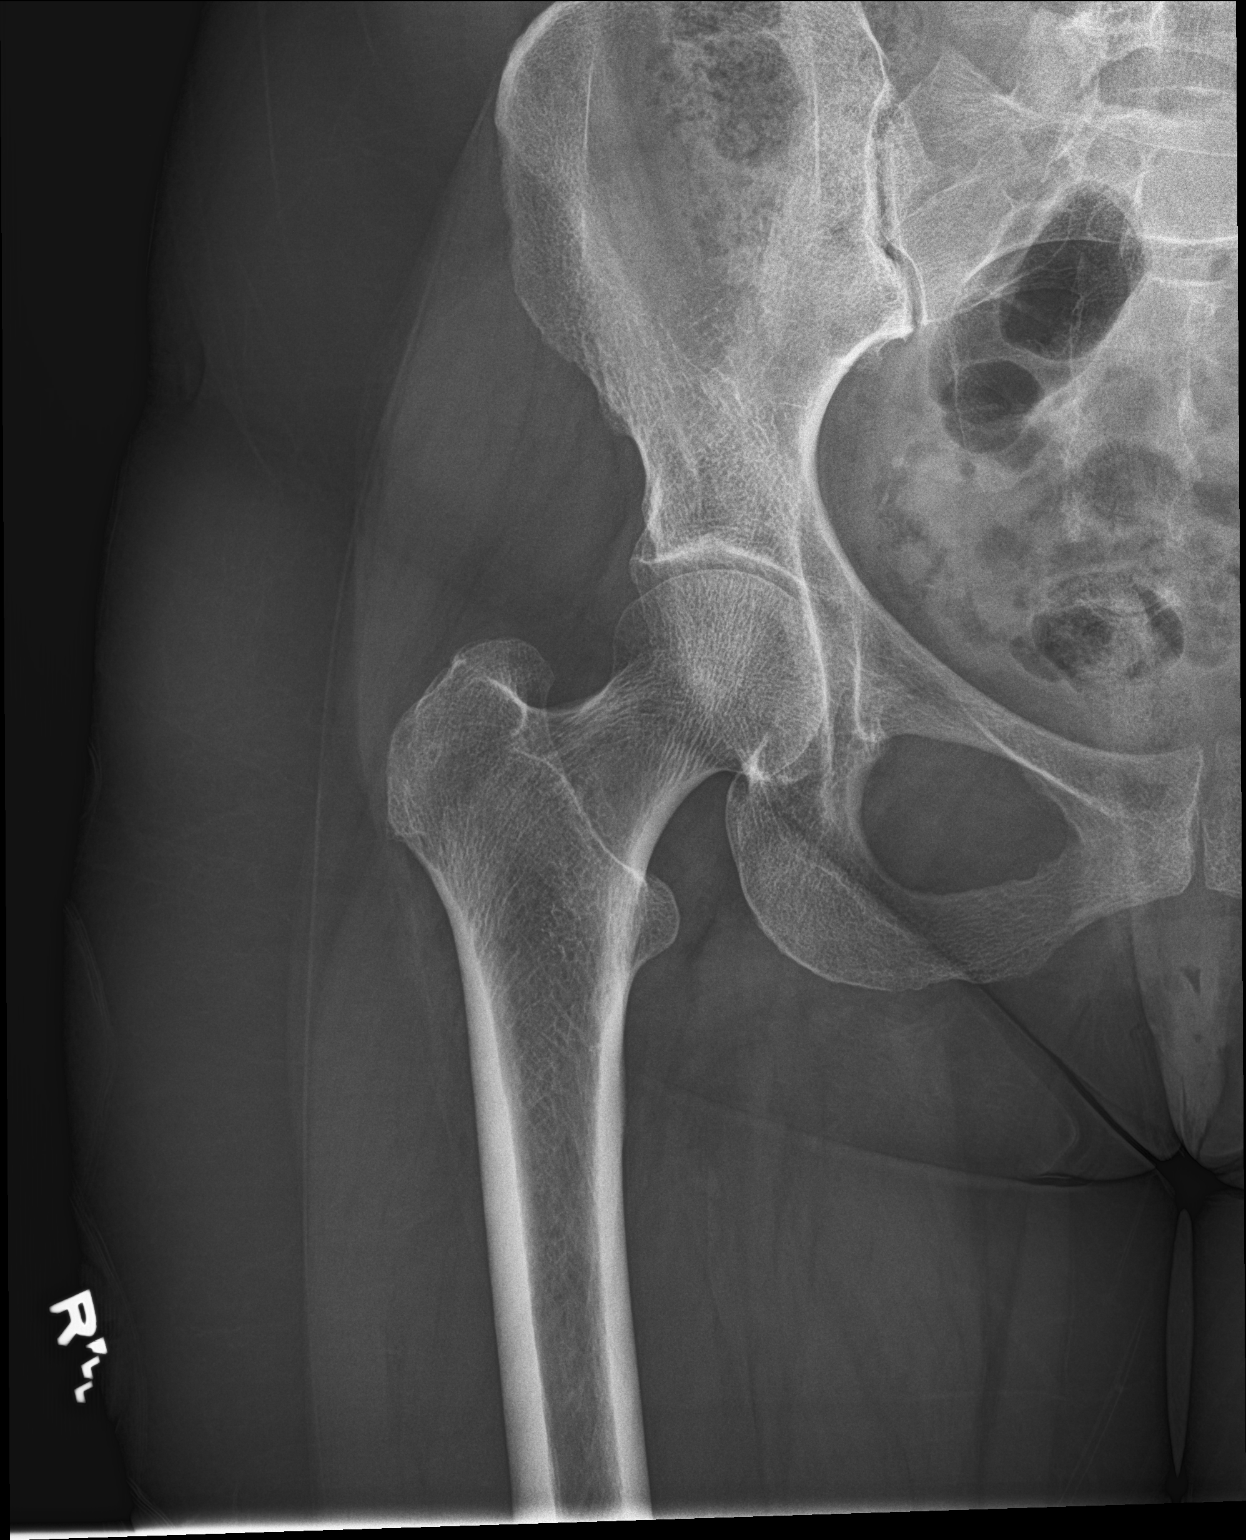

[hip lat]
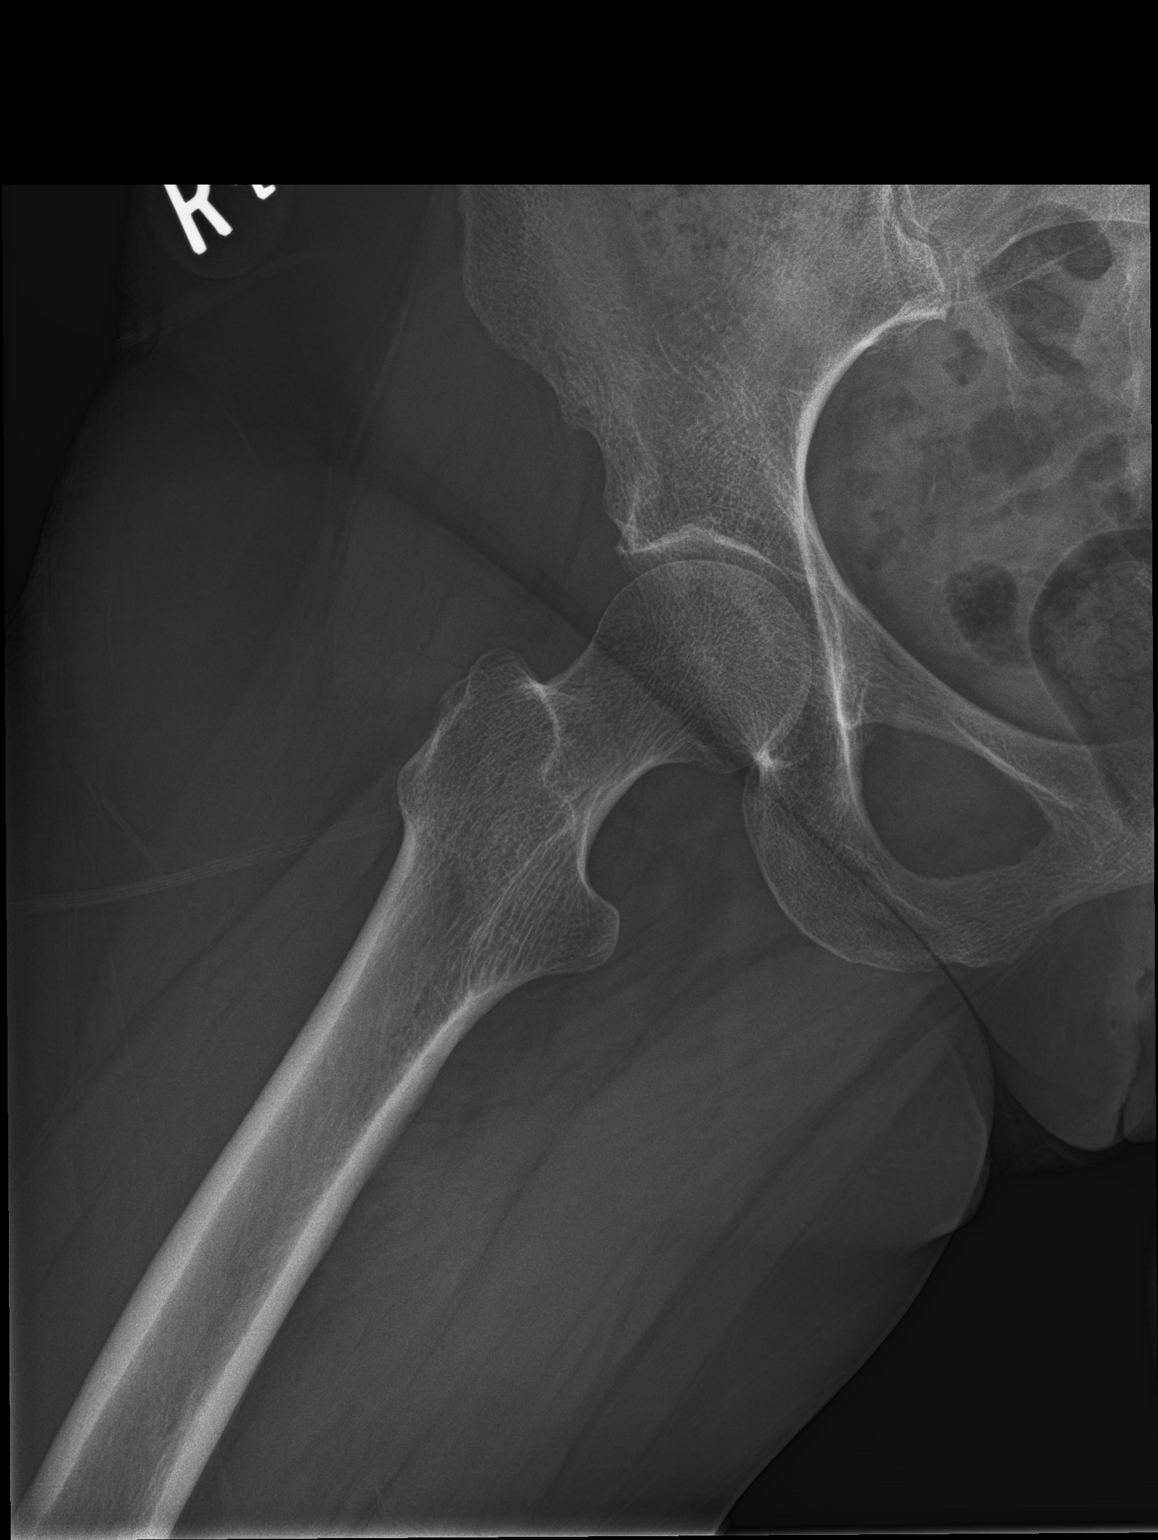

[3 of 3 positions shown; findings below may reference images not displayed]

FINDINGS: Hip joint space is maintained bilaterally. No subchondral
sclerosis/cyst formation. No osteophytosis.
IMPRESSION: Normal hips.
